# Patient Record
Sex: Female | Born: 1985 | Race: Black or African American | Hispanic: No | Marital: Single | Smoking: Current every day smoker
Health system: Southern US, Community
[De-identification: ages and names within clinical notes are randomized; demographics above are authoritative.]

## PROBLEM LIST (undated history)

## (undated) DIAGNOSIS — C539 Malignant neoplasm of cervix uteri, unspecified: Secondary | ICD-10-CM

## (undated) HISTORY — PX: ABDOMINAL CERCLAGE: SHX5384

## (undated) HISTORY — PX: KNEE SURGERY: SHX244

---

## 2018-04-01 ENCOUNTER — Emergency Department (HOSPITAL_COMMUNITY): Admission: EM | Admit: 2018-04-01 | Discharge: 2018-04-01 | Discharge status: Against Medical Advice | Payer: Self-pay

## 2018-04-01 NOTE — ED Notes (Signed)
Pt didn't answer again from triage

## 2018-04-01 NOTE — ED Notes (Signed)
Pt called for triage x1 No repsonse 

## 2018-04-25 ENCOUNTER — Other Ambulatory Visit: Payer: Self-pay

## 2018-04-25 ENCOUNTER — Encounter (HOSPITAL_COMMUNITY): Payer: Self-pay | Admitting: Emergency Medicine

## 2018-04-25 ENCOUNTER — Emergency Department (HOSPITAL_COMMUNITY)
Admission: EM | Admit: 2018-04-25 | Discharge: 2018-04-25 | Discharge status: Against Medical Advice | Payer: Medicaid Other | Attending: Emergency Medicine | Admitting: Emergency Medicine

## 2018-04-25 DIAGNOSIS — R109 Unspecified abdominal pain: Secondary | ICD-10-CM | POA: Diagnosis present

## 2018-04-25 DIAGNOSIS — R1030 Lower abdominal pain, unspecified: Secondary | ICD-10-CM | POA: Diagnosis not present

## 2018-04-25 DIAGNOSIS — F17200 Nicotine dependence, unspecified, uncomplicated: Secondary | ICD-10-CM | POA: Insufficient documentation

## 2018-04-25 HISTORY — DX: Malignant neoplasm of cervix uteri, unspecified: C53.9

## 2018-04-25 LAB — WET PREP, GENITAL
Clue Cells Wet Prep HPF POC: NONE SEEN
SPERM: NONE SEEN
Trich, Wet Prep: NONE SEEN
WBC, Wet Prep HPF POC: NONE SEEN
YEAST WET PREP: NONE SEEN

## 2018-04-25 LAB — URINALYSIS, ROUTINE W REFLEX MICROSCOPIC
Bilirubin Urine: NEGATIVE
Glucose, UA: NEGATIVE mg/dL
Hgb urine dipstick: NEGATIVE
KETONES UR: 5 mg/dL — AB
Leukocytes, UA: NEGATIVE
NITRITE: NEGATIVE
PH: 6 (ref 5.0–8.0)
Protein, ur: NEGATIVE mg/dL
SPECIFIC GRAVITY, URINE: 1.03 (ref 1.005–1.030)

## 2018-04-25 LAB — PREGNANCY, URINE: Preg Test, Ur: NEGATIVE

## 2018-04-25 NOTE — ED Provider Notes (Signed)
[facility] DEPT Provider Note   CSN: [id number] Arrival date & time: 04/25/18  1955     History   Chief Complaint Chief Complaint  Patient presents with  . Abdominal Pain    HPI Donna Ramos is a 32 y.o. female history of cervical cancer who presents for evaluation of intermittent abdominal pain that is been ongoing for last month.  Patient describes it as a cramping sensation.  She states it is constant but that it will vary in severity.  She states she has not taken anything for the pain.  She states that there is no alleviating or aggravating factor.  She has been able to drink and eat without any difficulty.  She denies any nausea/vomiting.  Patient states that she did not seek evaluation for this pain.  She states she came to the ED today because her boyfriend made her.  She denies any new symptoms or changes in symptoms.  Patient reports she has a history of IBS but does not follow-up with any GI doctor.  She states that this does not feel like a IBS flare.  Patient reports that 2 weeks ago, she had a large amount of vaginal bleeding.  She states that lasted for a few days and then resolved.  Denies any vaginal bleeding at this time.  She states her last menstrual cycle was in June.  She states her periods are abnormal.  She reports a history of tubal ligation.  She states she has had a history of cervical cancer and had her cervix stitch.  Patient does report that she smokes cigarettes.  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  Denies any cocaine, heroin use, alcohol.  Patient denies any fevers, chest pain, difficulty breathing, dysuria, hematuria, vaginal discharge.  The history is provided by the patient.    Past Medical History:  Diagnosis Date  . Cervical cancer [facility])    Patient did not ever get treatment    There are no active problems to display for this patient.   Past Surgical History:  Procedure  Laterality Date  . KNEE SURGERY       OB History   None      Home Medications    Prior to Admission medications   Not on File    Family History History reviewed. No pertinent family history.  Social History Social History   Tobacco Use  . Smoking status: Current Every Day Smoker  . Smokeless tobacco: Never Used  Substance Use Topics  . Alcohol use: Not Currently  . Drug use: Not Currently     Allergies   Patient has no known allergies.   Review of Systems Review of Systems  Constitutional: Negative for fever.  Respiratory: Negative for cough and shortness of breath.   Cardiovascular: Negative for chest pain.  Gastrointestinal: Positive for abdominal pain. Negative for nausea and vomiting.  Genitourinary: Positive for vaginal bleeding (Resolved). Negative for dysuria and hematuria.  Neurological: Negative for headaches.  All other systems reviewed and are negative.    Physical Exam Updated Vital Signs BP 115/65   Pulse 61   Temp 98.4 F (36.9 C) (Oral)   Resp 20   Ht 5\' 2"  (1.575 m)   Wt 77.1 kg   LMP 12/24/2017   SpO2 100%   BMI 31.09 kg/m   Physical Exam  Constitutional: She is oriented to person, place, and time. She appears well-developed and well-nourished.  HENT:  Head:  Normocephalic and atraumatic.  Mouth/Throat: Oropharynx is clear and moist and mucous membranes are normal.  Eyes: Pupils are equal, round, and reactive to light. Conjunctivae, EOM and lids are normal.  Neck: Full passive range of motion without pain.  Cardiovascular: Normal rate, regular rhythm, normal heart sounds and normal pulses. Exam reveals no gallop and no friction rub.  No murmur heard. Pulmonary/Chest: Effort normal and breath sounds normal.  Lungs clear to auscultation bilaterally.  Symmetric chest rise.  No wheezing, rales, rhonchi.  Abdominal: Soft. Normal appearance. There is tenderness in the right lower quadrant and suprapubic area. There is no rigidity, no  guarding and no CVA tenderness.  Abdomen is soft, nondistended.  Tenderness palpation noted to the right lower quadrant, suprapubic region.  No focal tenderness at McBurney's point.  No CVA tenderness bilaterally.  Genitourinary: Vagina normal and uterus normal. Cervix exhibits no motion tenderness, no discharge and no friability. Right adnexum displays no mass and no tenderness. Left adnexum displays no mass and no tenderness.  Genitourinary Comments: The exam was performed with a chaperone present. Normal external female genitalia. No lesions, rash, or sores.  Piercing noted to clitoris.  No surrounding warmth, erythema.  Cervical os is closed. Cervix is without any friability, erythema.  No evidence of abnormalities.  No adnexal mass or tenderness bilaterally.  Musculoskeletal: Normal range of motion.  Neurological: She is alert and oriented to person, place, and time.  Skin: Skin is warm and dry. Capillary refill takes less than 2 seconds.  Psychiatric: She has a normal mood and affect. Her speech is normal.  Nursing note and vitals reviewed.    ED Treatments / Results  Labs (all labs ordered are listed, but only abnormal results are displayed) Labs Reviewed  URINALYSIS, ROUTINE W REFLEX MICROSCOPIC - Abnormal; Notable for the following components:      Result Value   Ketones, ur 5 (*)    All other components within normal limits  WET PREP, GENITAL  PREGNANCY, URINE  LIPASE, BLOOD  COMPREHENSIVE METABOLIC PANEL  CBC  I-STAT BETA HCG BLOOD, ED ([facility], [facility], [facility] ONLY)  GC/CHLAMYDIA PROBE AMP ([place]) NOT AT [facility]    EKG None  Radiology No results found.  Procedures Procedures (including critical care time)  Medications Ordered in ED Medications - No data to display   Initial Impression / Assessment and Plan / ED Course  I have reviewed the triage vital signs and the nursing notes.  Pertinent labs & imaging results that were available during my care of the patient were  reviewed by me and considered in my medical decision making (see chart for details).     32 year old female who presents for evaluation of a month of abdominal cramping.  Reports that she also had an episode of vaginal bleeding approximately 2 weeks ago.  None since.  No fevers, nausea/vomiting.  States no new changes in symptoms but her boyfriend made her come in.  Last LMP was in June. Patient is afebrile, non-toxic appearing, sitting comfortably on examination table. Vital signs reviewed and stable.  On exam, tenderness palpation of the right lower quadrant, suprapubic region.  No CVA tenderness.  Consider infectious process though less likely given duration of symptoms and lack of fever, nausea/vomiting.  Also consider OB/GYN etiology versus uterine fibroids.  Plan for basic labs, pelvic exam.  Given pain in the right lower quadrant, will plan for CT abdomen pelvis to rule any acute infectious etiology.  Patient states that she was diagnosed with  cervical cancer in 2017 in [place].  She states that she was recommended to get chemotherapy but states she never got treatment.  Patient states that she had to get her cervix stitched up but does not know why.  She states that it occurred in 2014 after she had surgery.  She does not have an OB/GYN.  Again additionally, patient states that she was diagnosed with IBS but does not follow with any GI doctor.  Exam as documented above.  Cervix was without any abnormalities.  No CMT that would be concerning for PID.  No adnexal mass or tenderness bilaterally.  UA is negative for any hemoglobin, infectious etiology.  Urine pregnancy negative.  Wet prep is unremarkable.  Staff had difficulty obtaining IV and blood work was unable to be collected.  Multiple people tried.  RN informed me that IV team was consulted and were unable to get it.  I discussed with patient that we could try another member of our team to attempt an ultrasound-guided IV.  Patient refused to  be stuck anymore.  I discussed with patient that I cannot tell her any further information about her symptoms without blood work and further evaluation.  Additionally, I discussed with patient that I had a CT on pelvis ordered.  Patient states that she does not want to be stuck anymore and refuses any further work-up here in ED.  I discussed with patient that we would not be able to tell her any information regarding her symptoms without further work-up in the ED, including lab work, imaging, ultrasound if necessary.  Patient states that she was concerned that she was pregnant.  Discussed with her that urine pregnancy was negative but I cannot rule out any other acute abnormalities.  Discussed with patient that leaving prior to work-up being completed would be leaving [place].  Patient understands risk first benefits of leaving does not wish to stay any longer.  Final Clinical Impressions(s) / ED Diagnoses   Final diagnoses:  Lower abdominal pain    ED Discharge Orders    None       Desma Mcgregor 04/25/18 [id number]    Orlie Dakin, MD 04/26/18 [id number]

## 2018-04-25 NOTE — ED Notes (Signed)
Ultrasound IV and lab draw attempted without success.

## 2018-04-25 NOTE — ED Notes (Signed)
I attempted to collect labs and was unsuccessful. 

## 2018-04-25 NOTE — ED Notes (Signed)
Pt is refusing blood work and PIV sts just wants to know if she is prgenant, PA is aware.

## 2018-04-25 NOTE — ED Triage Notes (Signed)
Patient complaining of heavy bleeding. Patient states she has not had a period up until now. Patient states she was dx with cervical cancer and was not treated. Patient also states she is having abdominal pain.

## 2018-04-27 LAB — GC/CHLAMYDIA PROBE AMP (~~LOC~~) NOT AT ARMC
CHLAMYDIA, DNA PROBE: NEGATIVE
Neisseria Gonorrhea: NEGATIVE

## 2018-04-28 ENCOUNTER — Encounter (HOSPITAL_COMMUNITY): Payer: Self-pay | Admitting: *Deleted

## 2018-04-28 ENCOUNTER — Inpatient Hospital Stay (EMERGENCY_DEPARTMENT_HOSPITAL)
Admission: AD | Admit: 2018-04-28 | Discharge: 2018-04-28 | Disposition: A | Discharge status: Home / Self Care | Payer: Medicaid Other | Source: Ambulatory Visit | Attending: Obstetrics and Gynecology | Admitting: Obstetrics and Gynecology

## 2018-04-28 ENCOUNTER — Inpatient Hospital Stay (HOSPITAL_COMMUNITY)
Admission: AD | Admit: 2018-04-28 | Discharge: 2018-04-28 | Disposition: A | Discharge status: Home / Self Care | Payer: Medicaid Other | Source: Ambulatory Visit | Attending: Obstetrics and Gynecology | Admitting: Obstetrics and Gynecology

## 2018-04-28 ENCOUNTER — Encounter (HOSPITAL_COMMUNITY): Payer: Self-pay | Admitting: Emergency Medicine

## 2018-04-28 DIAGNOSIS — N920 Excessive and frequent menstruation with regular cycle: Secondary | ICD-10-CM | POA: Diagnosis present

## 2018-04-28 DIAGNOSIS — Z3202 Encounter for pregnancy test, result negative: Secondary | ICD-10-CM | POA: Insufficient documentation

## 2018-04-28 DIAGNOSIS — F17210 Nicotine dependence, cigarettes, uncomplicated: Secondary | ICD-10-CM | POA: Diagnosis not present

## 2018-04-28 DIAGNOSIS — N939 Abnormal uterine and vaginal bleeding, unspecified: Secondary | ICD-10-CM

## 2018-04-28 DIAGNOSIS — N921 Excessive and frequent menstruation with irregular cycle: Secondary | ICD-10-CM

## 2018-04-28 LAB — URINALYSIS, ROUTINE W REFLEX MICROSCOPIC
Bacteria, UA: NONE SEEN
GLUCOSE, UA: NEGATIVE mg/dL
KETONES UR: 20 mg/dL — AB
Nitrite: NEGATIVE
Specific Gravity, Urine: 1.04 — ABNORMAL HIGH (ref 1.005–1.030)
pH: 5 (ref 5.0–8.0)

## 2018-04-28 LAB — CBC
HCT: 40.1 % (ref 36.0–46.0)
Hemoglobin: 13.8 g/dL (ref 12.0–15.0)
MCH: 32.6 pg (ref 26.0–34.0)
MCHC: 34.4 g/dL (ref 30.0–36.0)
MCV: 94.8 fL (ref 80.0–100.0)
NRBC: 0 % (ref 0.0–0.2)
PLATELETS: 300 10*3/uL (ref 150–400)
RBC: 4.23 MIL/uL (ref 3.87–5.11)
RDW: 15.1 % (ref 11.5–15.5)
WBC: 6.2 10*3/uL (ref 4.0–10.5)

## 2018-04-28 LAB — POCT PREGNANCY, URINE: Preg Test, Ur: NEGATIVE

## 2018-04-28 LAB — HCG, QUANTITATIVE, PREGNANCY

## 2018-04-28 NOTE — MAU Note (Addendum)
Pt states she has been bleeding a lot since the early morning when she was here before. States she is bleeding and passing clots. Pt states she is having bright red bleeding that is so much it is just leaking out when she sits on toilet. Pt states she has had her tubes tied and that she also has her cervix 'stitched up'. Pt states LMP in June and that this is not her normal period. Pt states she feels movement in her abdomen. Pt states she is very worried she is losing a baby and even called it 'him.'  Pt aware that has had two negative pregnancy tests recently in MAU.  S.O. at bedside.

## 2018-04-28 NOTE — Discharge Instructions (Signed)
Menorrhagia Menorrhagia is when your menstrual periods are heavy or last longer than usual. Follow these instructions at home:  Only take medicine as told by your doctor.  Take any iron pills as told by your doctor. Heavy bleeding may cause low levels of iron in your body.  Do not take aspirin 1 week before or during your period. Aspirin can make the bleeding worse.  Lie down for a while if you change your tampon or pad more than once in 2 hours. This may help lessen the bleeding.  Eat a healthy diet and foods with iron. These foods include leafy green vegetables, meat, liver, eggs, and whole grain breads and cereals.  Do not try to lose weight. Wait until the heavy bleeding has stopped and your iron level is normal. Contact a doctor if:  You soak through a pad or tampon every 1 or 2 hours, and this happens every time you have a period.  You need to use pads and tampons at the same time because you are bleeding so much.  You need to change your pad or tampon during the night.  You have a period that lasts for more than 8 days.  You pass clots bigger than 1 inch (2.5 cm) wide.  You have irregular periods that happen more or less often than once a month.  You feel dizzy or pass out (faint).  You feel very weak or tired.  You feel short of breath or feel your heart is beating too fast when you exercise.  You feel sick to your stomach (nausea) and you throw up (vomit) while you are taking your medicine.  You have watery poop (diarrhea) while you are taking your medicine.  You have any problems that may be related to the medicine you are taking. Get help right away if:  You soak through 4 or more pads or tampons in 2 hours.  You have any bleeding while you are pregnant. This information is not intended to replace advice given to you by your health care provider. Make sure you discuss any questions you have with your health care provider. Document Released: 04/09/2008 Document  Revised: 12/07/2015 Document Reviewed: 12/31/2012 Elsevier Interactive Patient Education  2017 Elsevier Inc.  

## 2018-04-28 NOTE — MAU Note (Signed)
Pt presents to MAU c/o vaginal bleeding that started today pt states the bleeding is dark red and she is worried because the color of the bleeding is different than what she is used to. Pt reports being diagnosed with cervical cancer in 2017 pt states she was never treated because she is scared of hospital and bad news. Pt reports a hx of 2 stillborns and hx of having her tubs tied.

## 2018-04-28 NOTE — MAU Provider Note (Addendum)
History     CSN: [id number]  Arrival date and time: 04/28/18 6789   First Provider Initiated Contact with Patient 04/28/18 [phone]      Chief Complaint  Patient presents with  . Vaginal Bleeding  . Abdominal Pain   32 y.o female here with VB x2 days. Reports heavy VB into the toilet and passing clots. States this happened after she was here earlier this am. LMP was in June but had 3 days of bleeding about 2 weeks ago. Endorses heartburn type pain in her abdomen. She refuses to give more detail because "ya'll are just wasting time asking questions, please just do something". 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 She states "if I'm pregnant I need ya'll to do something now and not wait". She feels the bleeding resembles a pregnancy loss because of the color and quantity of the blood. Reports having 2 fevers at home, would not specify more. Partner is asking for an [place]. She is s/p BTL.   Past Medical History:  Diagnosis Date  . Cervical cancer [facility])    Patient did not ever get treatment    Past Surgical History:  Procedure Laterality Date  . KNEE SURGERY      Family History  Problem Relation Age of Onset  . ADD / ADHD Neg Hx   . Alcohol abuse Neg Hx   . Anxiety disorder Neg Hx   . Arthritis Neg Hx   . Asthma Neg Hx   . Birth defects Neg Hx   . Cancer Neg Hx   . COPD Neg Hx   . Depression Neg Hx   . Diabetes Neg Hx   . Drug abuse Neg Hx   . Early death Neg Hx   . Heart disease Neg Hx   . Hearing loss Neg Hx   . Hypertension Neg Hx   . Hyperlipidemia Neg Hx   . Intellectual disability Neg Hx   . Kidney disease Neg Hx   . Learning disabilities Neg Hx   . Miscarriages / Stillbirths Neg Hx   . Obesity Neg Hx   . Stroke Neg Hx   . Vision loss Neg Hx   . Varicose Veins Neg Hx     Social History   Tobacco Use  . Smoking status: Current Every Day Smoker    Packs/day: 0.50  . Smokeless tobacco: Never Used  Substance Use  Topics  . Alcohol use: Not Currently  . Drug use: Not Currently    Allergies: No Known Allergies  Medications Prior to Admission  Medication Sig Dispense Refill Last Dose  . acetaminophen (TYLENOL) 500 MG tablet Take 500-1,000 mg by mouth every 6 (six) hours as needed for moderate pain.   Past Week at Unknown time    Review of Systems  Gastrointestinal: Positive for abdominal pain.  Genitourinary: Positive for vaginal bleeding.   Physical Exam   Blood pressure (!) 139/93, pulse (!) 106, temperature 98.5 F (36.9 C), temperature source Oral, resp. rate 18, height 5\' 2"  (1.575 m), weight 73.4 kg.  Physical Exam  Constitutional: She appears well-developed and well-nourished. She appears distressed.  HENT:  Head: Normocephalic.  Neck: Normal range of motion.  Respiratory: Effort normal. No respiratory distress.  GI: Soft. She exhibits no distension and no mass. There is no tenderness. There is no rebound and no guarding.  Genitourinary:  Genitourinary Comments: External: no lesions or erythema Vagina: rugated, pink, moist, small drk red bloody discharge,  cleared with 1 fox swab Uterus: non enlarged, anteverted, non tender, no CMT Adnexae: no masses, no tenderness left, no tenderness right Cervix normal   Musculoskeletal: Normal range of motion.  Neurological: She is alert.  Skin: Skin is warm and dry.  Psychiatric: Her mood appears anxious.   Results for orders placed or performed during the hospital encounter of 04/28/18 (from the past 24 hour(s))  CBC     Status: None   Collection Time: 04/28/18  9:13 AM  Result Value Ref Range   WBC 6.2 4.0 - 10.5 K/uL   RBC 4.23 3.87 - 5.11 MIL/uL   Hemoglobin 13.8 12.0 - 15.0 g/dL   HCT 40.1 36.0 - 46.0 %   MCV 94.8 80.0 - 100.0 fL   MCH 32.6 26.0 - 34.0 pg   MCHC 34.4 30.0 - 36.0 g/dL   RDW 15.1 11.5 - 15.5 %   Platelets 300 150 - 400 K/uL   nRBC 0.0 0.0 - 0.2 %  hCG, quantitative, pregnancy     Status: None   Collection Time:  04/28/18  9:13 AM  Result Value Ref Range   hCG, Beta Chain, Quant, S <1 <5 mIU/mL   MAU Course  Procedures  MDM Labs ordered and reviewed. No evidence of pregnancy, pt and partner relieved by this. Discussed sx are consistent with menorrhagia. No evidence of acute abdominal or pelvic process. Outpt pelvic US ordered from previous MAU encounter. Stable for discharge home.  Assessment and Plan   1. Menorrhagia with irregular cycle   2. Pregnancy examination or test, negative result    Discharge home Follow up in [place] in 2 weeks Pelvic US in 1 week Return for emergencies  Allergies as of 04/28/2018   No Known Allergies     Medication List    TAKE these medications   acetaminophen 500 MG tablet Commonly known as:  TYLENOL Take 500-1,000 mg by mouth every 6 (six) hours as needed for moderate pain.      Julianne Handler, CNM 04/28/2018, 10:59 AM

## 2018-04-28 NOTE — Discharge Instructions (Signed)
° ° °  Center for [facility] at [facility]       Phone: [phone]  Center for [place] at [place] Phone: [place] for [place] at [place]  Phone: [place] for [place] at [facility]  Phone: [place] for [place] at [place]  Phone: [phone]      Dysfunctional Uterine Bleeding Dysfunctional uterine bleeding is abnormal bleeding from the uterus. Dysfunctional uterine bleeding includes:  A period that comes earlier or later than usual.  A period that is lighter, heavier, or has blood clots.  Bleeding between periods.  Skipping one or more periods.  Bleeding after sexual intercourse.  Bleeding after menopause.  Follow these instructions at home: Pay attention to any changes in your symptoms. Follow these instructions to help with your condition: Eating and drinking  Eat well-balanced meals. Include foods that are high in iron, such as liver, meat, shellfish, green leafy vegetables, and eggs.  If you become constipated: ? Drink plenty of water. ? Eat fruits and vegetables that are high in water and fiber, such as spinach, carrots, raspberries, apples, and mango. Medicines  Take over-the-counter and prescription medicines only as told by your health care provider.  Do not change medicines without talking with your health care provider.  Aspirin or medicines that contain aspirin may make the bleeding worse. Do not take those medicines: ? During the week before your period. ? During your period.  If you were prescribed iron pills, take them as told by your health care provider. Iron pills help to replace iron that your body loses because of this condition. Activity  If you need to change your sanitary pad or tampon more than one time every 2 hours: ? Lie in bed with your feet raised (elevated). ? Place a cold pack on your lower abdomen. ? Rest as much as  possible until the bleeding stops or slows down.  Do not try to lose weight until the bleeding has stopped and your blood iron level is back to normal. Other Instructions  For two months, write down: ? When your period starts. ? When your period ends. ? When any abnormal bleeding occurs. ? What problems you notice.  Keep all follow up visits as told by your health care provider. This is important. Contact a health care provider if:  You get light-headed or weak.  You have nausea and vomiting.  You cannot eat or drink without vomiting.  You feel dizzy or have diarrhea while you are taking medicines.  You are taking birth control pills or hormones, and you want to change them or stop taking them. Get help right away if:  You develop a fever or chills.  You need to change your sanitary pad or tampon more than one time per hour.  Your bleeding becomes heavier, or your flow contains clots more often.  You develop pain in your abdomen.  You lose consciousness.  You develop a rash. This information is not intended to replace advice given to you by your health care provider. Make sure you discuss any questions you have with your health care provider. Document Released: 06/28/2000 Document Revised: 12/07/2015 Document Reviewed: 09/26/2014 Elsevier Interactive Patient Education  [employer].

## 2018-04-28 NOTE — MAU Provider Note (Signed)
Chief Complaint: Vaginal Bleeding   First Provider Initiated Contact with Patient 04/28/18 0345     SUBJECTIVE HPI: Donna Ramos is a 32 y.o. [id number] at Unknown who presents to Maternity Admissions reporting vaginal bleeding. Had BTL in 2015. Reports being diagnosed with cervical cancer in 2017 in [place]; was told she needed chemo so she didn't f/u b/c she didn't want chemo. Had normal cycles until June. Did not have any bleeding from June until a few weeks ago. Has had bleeding off & on for the last 2 weeks. Was seen in ED 2 days ago with abdominal pain & vaginal bleeding. Had negative GC/CT, negative UPT, and normal pelvic exam.  States bleeding started back this morning & became heavy. Used 1 tampon, states if she had more tampons that she would have gone through 2 in 30 minutes. Does not have pads, so used toilet paper. Not saturating underwear. States blood is darker than normal for her. Denies abdominal pain.   Past Medical History:  Diagnosis Date  . Cervical cancer ([place])    Patient did not ever get treatment   OB History  Gravida Para Term Preterm AB Living  6 4 3 1 2 2   SAB TAB Ectopic Multiple Live Births               # Outcome Date GA Lbr Len/2nd Weight Sex Delivery Anes PTL Lv  6 Term         FD  5 Preterm         FD  4 AB           3 AB           2 Term           1 Term            Past Surgical History:  Procedure Laterality Date  . KNEE SURGERY     Social History   Socioeconomic History  . Marital status: Single    Spouse name: Not on file  . Number of children: Not on file  . Years of education: Not on file  . Highest education level: Not on file  Occupational History  . Not on file  Social Needs  . Financial resource strain: Not on file  . Food insecurity:    Worry: Not on file    Inability: Not on file  . Transportation needs:    Medical: Not on file    Non-medical: Not on file  Tobacco Use  . Smoking status: Current Every Day Smoker    Packs/day:  0.50  . Smokeless tobacco: Never Used  Substance and Sexual Activity  . Alcohol use: Not Currently  . Drug use: Not Currently  . Sexual activity: Yes  Lifestyle  . Physical activity:    Days per week: Not on file    Minutes per session: Not on file  . Stress: Not on file  Relationships  . Social connections:    Talks on phone: Not on file    Gets together: Not on file    Attends religious service: Not on file    Active member of club or organization: Not on file    Attends meetings of clubs or organizations: Not on file    Relationship status: Not on file  . Intimate partner violence:    Fear of current or ex partner: Not on file    Emotionally abused: Not on file    Physically abused: Not on file  Forced sexual activity: Not on file  Other Topics Concern  . Not on file  Social History Narrative  . Not on file   Family History  Problem Relation Age of Onset  . ADD / ADHD Neg Hx   . Alcohol abuse Neg Hx   . Anxiety disorder Neg Hx   . Arthritis Neg Hx   . Asthma Neg Hx   . Birth defects Neg Hx   . Cancer Neg Hx   . COPD Neg Hx   . Depression Neg Hx   . Diabetes Neg Hx   . Drug abuse Neg Hx   . Early death Neg Hx   . Heart disease Neg Hx   . Hearing loss Neg Hx   . Hypertension Neg Hx   . Hyperlipidemia Neg Hx   . Intellectual disability Neg Hx   . Kidney disease Neg Hx   . Learning disabilities Neg Hx   . Miscarriages / Stillbirths Neg Hx   . Obesity Neg Hx   . Stroke Neg Hx   . Vision loss Neg Hx   . Varicose Veins Neg Hx    No current facility-administered medications on file prior to encounter.    No current outpatient medications on file prior to encounter.   No Known Allergies  I have reviewed patient's Past Medical Hx, Surgical Hx, Family Hx, Social Hx, medications and allergies.   Review of Systems  Constitutional: Negative.   Gastrointestinal: Negative.   Genitourinary: Positive for menstrual problem and vaginal bleeding.     OBJECTIVE Patient Vitals for the past 24 hrs:  BP Temp Temp src Pulse Resp SpO2  04/28/18 0449 136/81 - - (!) 110 17 100 %  04/28/18 0308 (!) 129/59 98.6 F (37 C) Oral (!) 109 17 -   Constitutional: Well-developed, well-nourished female in no acute distress.  Cardiovascular: normal rate & rhythm, no murmur Respiratory: normal rate and effort. Lung sounds clear throughout GI: Abd soft, non-tender, Pos BS x 4. No guarding or rebound tenderness MS: Extremities nontender, no edema, normal ROM Neurologic: Alert and oriented x 4.  GU:     SPECULUM EXAM: NEFG, physiologic discharge, minimal dark red blood noted on exam. Cervix pink & smooth  BIMANUAL: No CMT. uterus normal size, no adnexal tenderness or masses.    LAB RESULTS Results for orders placed or performed during the hospital encounter of 04/28/18 (from the past 24 hour(s))  Urinalysis, Routine w reflex microscopic     Status: Abnormal   Collection Time: 04/28/18  3:06 AM  Result Value Ref Range   Color, Urine AMBER (A) YELLOW   APPearance CLOUDY (A) CLEAR   Specific Gravity, Urine 1.040 (H) 1.005 - 1.030   pH 5.0 5.0 - 8.0   Glucose, UA NEGATIVE NEGATIVE mg/dL   Hgb urine dipstick LARGE (A) NEGATIVE   Bilirubin Urine SMALL (A) NEGATIVE   Ketones, ur 20 (A) NEGATIVE mg/dL   Protein, ur >=300 (A) NEGATIVE mg/dL   Nitrite NEGATIVE NEGATIVE   Leukocytes, UA SMALL (A) NEGATIVE   RBC / HPF >50 (H) 0 - 5 RBC/hpf   WBC, UA 21-50 0 - 5 WBC/hpf   Bacteria, UA NONE SEEN NONE SEEN   Squamous Epithelial / LPF 21-50 0 - 5   Mucus PRESENT   Pregnancy, urine POC     Status: None   Collection Time: 04/28/18  3:19 AM  Result Value Ref Range   Preg Test, Ur NEGATIVE NEGATIVE    IMAGING No results found.  MAU COURSE Orders Placed This Encounter  Procedures  . US PELVIS TRANSVANGINAL NON-OB (TV ONLY)  . US PELVIS (TRANSABDOMINAL ONLY)  . Urinalysis, Routine w reflex microscopic  . Pregnancy, urine POC  . Discharge patient    No orders of the defined types were placed in this encounter.   MDM UPT negative Did not collect vaginal swabs as they were negative 2 days ago Discussed CBC with patient which she was agreeable to. When phlebotomist entered room, pt refused lab draw Pt requesting ultrasound. Emergent gyn ultrasound not indicated tonight. Will order outpatient ultrasound. Pt needs f/u with gyn for management of AUB & to reevaluate for cervical cancer; no record of diagnosis in care everywhere.   ASSESSMENT 1. Abnormal uterine bleeding (AUB)     PLAN Discharge home in stable condition.  Follow-up [facility] Follow up.   Specialty:  Radiology Why:  Will call you to schedule outpatient ultrasound Contact information: [address] [id number] [place] [phone]         Allergies as of 04/28/2018   No Known Allergies     Medication List    You have not been prescribed any medications.      Jorje Guild, NP 04/28/2018  9:35 AM

## 2018-05-01 ENCOUNTER — Ambulatory Visit (HOSPITAL_COMMUNITY)
Admission: RE | Admit: 2018-05-01 | Discharge: 2018-05-01 | Disposition: A | Discharge status: Home / Self Care | Payer: Medicaid Other | Source: Ambulatory Visit | Attending: Student | Admitting: Student

## 2018-05-01 DIAGNOSIS — N939 Abnormal uterine and vaginal bleeding, unspecified: Secondary | ICD-10-CM | POA: Diagnosis present

## 2018-05-02 ENCOUNTER — Encounter: Payer: Self-pay | Admitting: Student

## 2018-05-04 ENCOUNTER — Telehealth: Payer: Self-pay | Admitting: Obstetrics & Gynecology

## 2018-05-04 NOTE — Telephone Encounter (Signed)
Called patient to give her an appointment. Patient id requesting a call back from the nurse about her results.

## 2018-05-06 NOTE — Telephone Encounter (Signed)
I returned pt's call and discussed her concern. She stated that she wants to know what is going on with her body and why she has been having abdominal pain and vaginal bleeding. I advised that when she has office appt on 11/6, the doctor will review all of her test results and most likely perform a pelvic exam. The doctor will make recommendations for treatment options based on all of the information obtained. Pt voiced understanding.

## 2018-05-18 ENCOUNTER — Telehealth: Payer: Self-pay | Admitting: *Deleted

## 2018-05-18 NOTE — Telephone Encounter (Signed)
Received a voicemail from 05/15/18 5:42pm( after office closed for weekend) stating she is calling about results and that someone called about something. I do see a note from several days ago that Donna Ramos has an appointment 05/20/18 2:55 to review results and plan of care.  I called Donna Ramos and left a message I am returning her call and I do see she has appt 05/20/18 and we can answer her questions then- call ius again if she has questions before then.

## 2018-05-20 ENCOUNTER — Encounter: Payer: Medicaid Other | Admitting: Obstetrics & Gynecology

## 2018-05-20 NOTE — Progress Notes (Deleted)
   Patient did not show up today for her scheduled appointment.   Heywood Tokunaga, MD, FACOG Obstetrician & Gynecologist, [facility], [facility]  

## 2018-05-21 ENCOUNTER — Telehealth: Payer: Self-pay | Admitting: Family Medicine

## 2018-05-21 NOTE — Telephone Encounter (Signed)
Pt called wanting to know her test results and would like for someone to give her a call back. Stated someone had called her before but she had missed the call and did not have a chance to call back.

## 2018-05-21 NOTE — Telephone Encounter (Signed)
I called Donna Ramos back and reviewed her lab results from 04/28/18 and [place] from 05/01/18 with her; as well as her gyn appointment. I advised her to keep appt as provider would review results with her, do exam and any further testing needed for bleeding issues. Also to go to MAU for heavy bleeding. She voices understanding.

## 2018-06-08 ENCOUNTER — Encounter: Payer: Medicaid Other | Admitting: Obstetrics & Gynecology

## 2019-01-09 ENCOUNTER — Other Ambulatory Visit: Payer: Self-pay

## 2019-01-09 ENCOUNTER — Emergency Department (HOSPITAL_COMMUNITY)
Admission: EM | Admit: 2019-01-09 | Discharge: 2019-01-09 | Disposition: A | Discharge status: Home / Self Care | Payer: Medicaid Other | Attending: Emergency Medicine | Admitting: Emergency Medicine

## 2019-01-09 ENCOUNTER — Emergency Department (HOSPITAL_COMMUNITY)
Admission: EM | Admit: 2019-01-09 | Discharge: 2019-01-10 | Disposition: A | Discharge status: Home / Self Care | Payer: Medicaid Other | Source: Home / Self Care | Attending: Emergency Medicine | Admitting: Emergency Medicine

## 2019-01-09 ENCOUNTER — Encounter (HOSPITAL_COMMUNITY): Payer: Self-pay

## 2019-01-09 DIAGNOSIS — K0889 Other specified disorders of teeth and supporting structures: Secondary | ICD-10-CM

## 2019-01-09 DIAGNOSIS — R220 Localized swelling, mass and lump, head: Secondary | ICD-10-CM | POA: Diagnosis not present

## 2019-01-09 DIAGNOSIS — Z8541 Personal history of malignant neoplasm of cervix uteri: Secondary | ICD-10-CM | POA: Diagnosis not present

## 2019-01-09 DIAGNOSIS — K047 Periapical abscess without sinus: Secondary | ICD-10-CM

## 2019-01-09 DIAGNOSIS — K029 Dental caries, unspecified: Secondary | ICD-10-CM

## 2019-01-09 DIAGNOSIS — F17210 Nicotine dependence, cigarettes, uncomplicated: Secondary | ICD-10-CM | POA: Diagnosis not present

## 2019-01-09 DIAGNOSIS — K122 Cellulitis and abscess of mouth: Secondary | ICD-10-CM

## 2019-01-09 MED ORDER — KETOROLAC TROMETHAMINE 30 MG/ML IJ SOLN
30.0000 mg | Freq: Once | INTRAMUSCULAR | Status: AC | PRN
  Administered 2019-01-10: 30 mg via INTRAVENOUS
  Filled 2019-01-09: qty 1

## 2019-01-09 MED ORDER — OXYCODONE-ACETAMINOPHEN 5-325 MG PO TABS
1.0000 | ORAL_TABLET | Freq: Once | ORAL | Status: AC
  Administered 2019-01-09: 1 via ORAL
  Filled 2019-01-09: qty 1

## 2019-01-09 MED ORDER — PENICILLIN V POTASSIUM 500 MG PO TABS: 500.0000 mg | ORAL_TABLET | Freq: Four times a day (QID) | ORAL | 0 refills | Status: AC

## 2019-01-09 MED ORDER — OXYCODONE HCL 5 MG PO TABS
5.0000 mg | ORAL_TABLET | Freq: Once | ORAL | Status: AC
  Administered 2019-01-09: 5 mg via ORAL
  Filled 2019-01-09: qty 1

## 2019-01-09 MED ORDER — CLINDAMYCIN PHOSPHATE 600 MG/50ML IV SOLN
600.0000 mg | Freq: Once | INTRAVENOUS | Status: AC
  Administered 2019-01-09: 600 mg via INTRAVENOUS
  Filled 2019-01-09: qty 50

## 2019-01-09 NOTE — ED Provider Notes (Signed)
[facility] EMERGENCY DEPARTMENT Provider Note   CSN: [id number] Arrival date & time: 01/09/19  1530     History   Chief Complaint Chief Complaint  Patient presents with  . Dental Pain    HPI Donna Ramos is a 33 y.o. female.     The history is provided by the patient and medical records. No language interpreter was used.  Dental Pain Associated symptoms: facial swelling   Associated symptoms: no fever     Donna Ramos is a 33 y.o. female who presents to the Emergency Department complaining of persistent, gradually worsening, right-sided, lower dental pain beginning 3 days ago. Pt describes their pain as throbbing. Had some mild swelling close to the sight beginning this morning. Pt has been taking ibuprofen and using Oragel at home with minimal relief of pain. Pt denies fever, chills, difficulty breathing, difficulty swallowing.    Past Medical History:  Diagnosis Date  . Cervical cancer [facility])    Patient did not ever get treatment    There are no active problems to display for this patient.   Past Surgical History:  Procedure Laterality Date  . ABDOMINAL CERCLAGE    . KNEE SURGERY       OB History    Gravida  6   Para  4   Term  3   Preterm  1   AB  2   Living  2     SAB      TAB      Ectopic      Multiple      Live Births               Home Medications    Prior to Admission medications   Medication Sig Start Date End Date Taking? Authorizing Provider  acetaminophen (TYLENOL) 500 MG tablet Take 500-1,000 mg by mouth every 6 (six) hours as needed for moderate pain.    [provider]  penicillin v potassium (VEETID) 500 MG tablet Take 1 tablet (500 mg total) by mouth 4 (four) times daily for 7 days. 01/09/19 01/16/19  Tami Blass, Ozella Almond, PA-C    Family History Family History  Problem Relation Age of Onset  . ADD / ADHD Neg Hx   . Alcohol abuse Neg Hx   . Anxiety disorder Neg Hx   . Arthritis Neg  Hx   . Asthma Neg Hx   . Birth defects Neg Hx   . Cancer Neg Hx   . COPD Neg Hx   . Depression Neg Hx   . Diabetes Neg Hx   . Drug abuse Neg Hx   . Early death Neg Hx   . Heart disease Neg Hx   . Hearing loss Neg Hx   . Hypertension Neg Hx   . Hyperlipidemia Neg Hx   . Intellectual disability Neg Hx   . Kidney disease Neg Hx   . Learning disabilities Neg Hx   . Miscarriages / Stillbirths Neg Hx   . Obesity Neg Hx   . Stroke Neg Hx   . Vision loss Neg Hx   . Varicose Veins Neg Hx     Social History Social History   Tobacco Use  . Smoking status: Current Every Day Smoker    Packs/day: 0.50  . Smokeless tobacco: Never Used  Substance Use Topics  . Alcohol use: Not Currently  . Drug use: Not Currently     Allergies   Patient has no known allergies.   Review  of Systems Review of Systems  Constitutional: Negative for chills and fever.  HENT: Positive for dental problem and facial swelling. Negative for sore throat and trouble swallowing.   Respiratory: Negative for cough and shortness of breath.      Physical Exam Updated Vital Signs BP 132/87   Pulse 98   Temp 98 F (36.7 C) (Oral)   Resp 18   SpO2 99%   Physical Exam Vitals signs and nursing note reviewed.  Constitutional:      General: She is not in acute distress.    Appearance: She is well-developed.  HENT:     Head: Normocephalic and atraumatic.     Mouth/Throat:      Comments: Dental cavities and poor oral dentition noted. Pain along tooth as depicted in image. No abscess noted. Midline uvula. No trismus. OP moist and clear. No oropharyngeal erythema or edema. Neck supple with no tenderness. Very mild facial edema overlying affected tooth. Neck:     Musculoskeletal: Neck supple.  Cardiovascular:     Rate and Rhythm: Normal rate and regular rhythm.     Heart sounds: Normal heart sounds. No murmur.  Pulmonary:     Effort: Pulmonary effort is normal. No respiratory distress.     Breath sounds:  Normal breath sounds. No wheezing or rales.  Abdominal:     Comments: Mild epigastric tenderness. No RUQ tenderness. Negative Murphy's.   Musculoskeletal: Normal range of motion.  Skin:    General: Skin is warm and dry.  Neurological:     Mental Status: She is alert.      ED Treatments / Results  Labs (all labs ordered are listed, but only abnormal results are displayed) Labs Reviewed - No data to display  EKG    Radiology No results found.  Procedures Procedures (including critical care time)  Medications Ordered in ED Medications  oxyCODONE (Oxy IR/ROXICODONE) immediate release tablet 5 mg (has no administration in time range)     Initial Impression / Assessment and Plan / ED Course  I have reviewed the triage vital signs and the nursing notes.  Pertinent labs & imaging results that were available during my care of the patient were reviewed by me and considered in my medical decision making (see chart for details).       Donna Ramos is a 33 y.o. female who presents to ED for dental pain. No abscess requiring immediate incision and drainage. Patient is afebrile, non toxic appearing, and swallowing secretions well. Exam not concerning for Ludwig's angina or pharyngeal abscess. Will treat with PenVK. Having some upper abdominal discomfort after taking multiple doses of NSAID's in the last 3 days. Non-surgical abdominal exam. Likely 2/2 nsaid's. Encouraged tylenol instead. I provided dental referral info and stressed the importance of dental follow up for ultimate management of dental pain. Patient voices understanding and is agreeable to plan.   Final Clinical Impressions(s) / ED Diagnoses   Final diagnoses:  Pain, dental    ED Discharge Orders         Ordered    penicillin v potassium (VEETID) 500 MG tablet  4 times daily     01/09/19 1614           Yamir Carignan, Ozella Almond, PA-C 01/09/19 1618    Lajean Saver, MD 01/09/19 1723

## 2019-01-09 NOTE — ED Triage Notes (Signed)
Pt c/o right lower dental pain x3 days along with right sided cheek swelling

## 2019-01-09 NOTE — ED Provider Notes (Signed)
33 year old female received at sign out from [place] [facility] pending labs and imaging. Per her HPI:   "The history is provided by the patient and medical records. No language interpreter was used.  Dental Pain Associated symptoms: facial swelling    Donna Ramos is a 33 y.o. female who presents to the Emergency Department complaining of worsening facial swelling since ED evaluation earlier today. I saw this patient several hours ago for right lower dental pain. She reports that she has continued to have pain not relieved with Tylenol, but of more concern to her was the fact that the right side of her face began to swell up very badly. She did get PenVK rx filled. She took one dose when she picked up the prescription and another before she laid down for bed. She couldn't sleep so went to the bathroom and noticed how swollen her face looked compared to earlier. I told her to come back to ER should the swelling get drastically worse, which she did. She still has no fever, difficulty breathing or swallowing."  Physical Exam  BP 119/64 (BP Location: Right Arm)   Pulse (!) 108   Temp 99.1 F (37.3 C) (Oral)   Resp 18   SpO2 100%   Physical Exam Vitals signs and nursing note reviewed.  Constitutional:      General: She is not in acute distress. HENT:     Head: Normocephalic.     Nose: Nose normal.     Mouth/Throat:     Mouth: No angioedema.     Pharynx: Oropharynx is clear. Uvula midline.     Comments: Swelling noted to the right upper lip and right lower face.  Posterior oropharynx is patent.  No trismus.  She is tolerating secretions without difficulty.  She has submental tenderness, but no sublingual induration or swelling. Eyes:     Conjunctiva/sclera: Conjunctivae normal.  Neck:     Musculoskeletal: Neck supple.  Cardiovascular:     Rate and Rhythm: Normal rate and regular rhythm.     Heart sounds: No murmur. No friction rub. No gallop.   Pulmonary:     Effort: Pulmonary effort is  normal. No respiratory distress.     Breath sounds: Normal breath sounds. No stridor. No wheezing, rhonchi or rales.     Comments: Lungs are clear to auscultation bilaterally.  No tachypnea.  No increased work of breathing. Abdominal:     General: There is no distension.     Palpations: Abdomen is soft.  Skin:    General: Skin is warm.     Findings: No rash.  Neurological:     Mental Status: She is alert.  Psychiatric:        Behavior: Behavior normal.    ED Course/Procedures   Clinical Course as of Jan 09 534  Sun Jan 10, 2019  0115 Notified by nursing staff that patient's swelling has continued to worsen.  My evaluation, the patient has swelling that extends to the right half of the inferior lip and to the right lower face.  She has no trismus or drooling.  She is protecting her airway and is not complaining of shortness of breath.  Patient is now receiving Toradol and I will add on Decadron.   [MM]    Clinical Course User Index [MM] Arbor Leer A, PA-C    Procedures  MDM   33 year old female received at signout from [facility] pending labs and imaging.  This is her second visit to the ER today  after she was initially seen for dental pain and then developed sudden onset, rapidly worsening swelling to the right lower face.  Labs are reassuring.  No abnormalities.  CT with odontogenic cellulitis of the right lower face secondary to large periapical lucency at the root of tooth 32.  No subperiosteal abscess or drainable fluid collection.  After the patient received a dose of IV clindamycin, Toradol, and Decadron, swelling was much improved.  She was tolerating fluids in the ER without difficulty.  She is having no shortness of breath, muffled voice, and is tolerating her own secretions.  Will discharge to home with oral clindamycin and follow-up with outpatient dentistry.  Patient is agreeable with plan.  She was also given strict return precautions to the emergency department.  All  questions answered.  She is hemodynamically stable and in no acute distress.  She is safe for discharge to home with outpatient close follow-up.       Joanne Gavel, PA-C 01/10/19 [place], [place], MD 01/11/19 [phone]

## 2019-01-09 NOTE — Discharge Instructions (Signed)
It is very important that you get evaluated by a dentist as soon as possible. Call tomorrow to schedule an appointment. Tylenol as needed for pain. Avoid ibuprofen, naproxen, aleve, goody powder, etc. Those can make your upper stomach hurt worse.   Take your full course of antibiotics. Read the instructions below.  Eat a soft or liquid diet and rinse your mouth out after meals with warm water. You should see a dentist or return here at once if you have increased swelling, increased pain or uncontrolled bleeding from the site of your injury.  SEEK MEDICAL CARE IF:  Swelling gets worse You have bleeding which starts, continues, or gets worse.  You develop a fever If you are unable to open your mouth New or worsening symptoms develop or you have any additional concerns.

## 2019-01-09 NOTE — ED Triage Notes (Signed)
Pt returns for continued dental pain, pt was discharged a few hours ago.

## 2019-01-09 NOTE — ED Provider Notes (Cosign Needed)
[facility] EMERGENCY DEPARTMENT Provider Note   CSN: [id number] Arrival date & time: 01/09/19  2227     History   Chief Complaint Chief Complaint  Patient presents with  . Dental Pain    HPI Carina Chaplin is a 33 y.o. female.     The history is provided by the patient and medical records. No language interpreter was used.  Dental Pain Associated symptoms: facial swelling    Madysen Faircloth is a 33 y.o. female who presents to the Emergency Department complaining of worsening facial swelling since ED evaluation earlier today. I saw this patient several hours ago for right lower dental pain. She reports that she has continued to have pain not relieved with Tylenol, but of more concern to her was the fact that the right side of her face began to swell up very badly. She did get PenVK rx filled. She took one dose when she picked up the prescription and another before she laid down for bed. She couldn't sleep so went to the bathroom and noticed how swollen her face looked compared to earlier. I told her to come back to ER should the swelling get drastically worse, which she did. She still has no fever, difficulty breathing or swallowing.   Past Medical History:  Diagnosis Date  . Cervical cancer [facility])    Patient did not ever get treatment    There are no active problems to display for this patient.   Past Surgical History:  Procedure Laterality Date  . ABDOMINAL CERCLAGE    . KNEE SURGERY       OB History    Gravida  6   Para  4   Term  3   Preterm  1   AB  2   Living  2     SAB      TAB      Ectopic      Multiple      Live Births               Home Medications    Prior to Admission medications   Medication Sig Start Date End Date Taking? Authorizing Provider  acetaminophen (TYLENOL) 500 MG tablet Take 500-1,000 mg by mouth every 6 (six) hours as needed for moderate pain.    [provider]  penicillin v potassium  (VEETID) 500 MG tablet Take 1 tablet (500 mg total) by mouth 4 (four) times daily for 7 days. 01/09/19 01/16/19  Ustin Cruickshank, Ozella Almond, PA-C    Family History Family History  Problem Relation Age of Onset  . ADD / ADHD Neg Hx   . Alcohol abuse Neg Hx   . Anxiety disorder Neg Hx   . Arthritis Neg Hx   . Asthma Neg Hx   . Birth defects Neg Hx   . Cancer Neg Hx   . COPD Neg Hx   . Depression Neg Hx   . Diabetes Neg Hx   . Drug abuse Neg Hx   . Early death Neg Hx   . Heart disease Neg Hx   . Hearing loss Neg Hx   . Hypertension Neg Hx   . Hyperlipidemia Neg Hx   . Intellectual disability Neg Hx   . Kidney disease Neg Hx   . Learning disabilities Neg Hx   . Miscarriages / Stillbirths Neg Hx   . Obesity Neg Hx   . Stroke Neg Hx   . Vision loss Neg Hx   . Varicose Veins  Neg Hx     Social History Social History   Tobacco Use  . Smoking status: Current Every Day Smoker    Packs/day: 0.50  . Smokeless tobacco: Never Used  Substance Use Topics  . Alcohol use: Not Currently  . Drug use: Not Currently     Allergies   Patient has no known allergies.   Review of Systems Review of Systems  HENT: Positive for dental problem and facial swelling.   All other systems reviewed and are negative.    Physical Exam Updated Vital Signs BP 117/76   Pulse 96   Temp 99.1 F (37.3 C) (Oral)   Resp 18   SpO2 100%   Physical Exam Vitals signs and nursing note reviewed.  Constitutional:      General: She is not in acute distress.    Appearance: She is well-developed.  HENT:     Head: Normocephalic and atraumatic.     Mouth/Throat:      Comments: Facial edema much more significant than my previous exam several hours ago. She does have tenderness to the right submandibular space. Diffuse tenderness to the gumline indicated by markings on imaging above. Right cheek feels indurated as well. No sublingual tenderness. Uvula midline. Neck:     Musculoskeletal: Neck supple.   Cardiovascular:     Rate and Rhythm: Normal rate and regular rhythm.     Heart sounds: Normal heart sounds. No murmur.  Pulmonary:     Effort: Pulmonary effort is normal. No respiratory distress.     Breath sounds: Normal breath sounds. No wheezing or rales.  Musculoskeletal: Normal range of motion.  Skin:    General: Skin is warm and dry.  Neurological:     Mental Status: She is alert.      ED Treatments / Results  Labs (all labs ordered are listed, but only abnormal results are displayed) Labs Reviewed - No data to display  EKG    Radiology No results found.  Procedures Procedures (including critical care time)  Medications Ordered in ED Medications  clindamycin (CLEOCIN) IVPB 600 mg (has no administration in time range)  oxyCODONE-acetaminophen (PERCOCET/ROXICET) 5-325 MG per tablet 1 tablet (has no administration in time range)     Initial Impression / Assessment and Plan / ED Course  I have reviewed the triage vital signs and the nursing notes.  Pertinent labs & imaging results that were available during my care of the patient were reviewed by me and considered in my medical decision making (see chart for details).       Najmo Pardue is a 33 y.o. female who presents to ED for dental pain and facial swelling. I actually saw her earlier today around 3:30 PM for same. At that time, she had very minimal facial edema. I started her on PenVK. She did get rx filled and has taken two doses thus far. Since her discharge from ED a few hours ago, she has had worsening right-sided facial swelling. It is much more extensive than previous encounter. She has no sublingual tenderness, but is overtly tender in the right submandibular space. Cheek is also quite indurated. Likely has dental abscess, possibly facial cellulitis. Given worsening swelling and submandibular tenderness, will obtain CT to further characterize and ensure no spread to deep neck space. Will also give dose of  IV ABX while in ED.   Labs and CT pending at shift change. Care assumed by oncoming provider PA McDonald. Case discussed, plan agreed upon. Will follow up on  pending tests and dispo appropriately.    Final Clinical Impressions(s) / ED Diagnoses   Final diagnoses:  None    ED Discharge Orders    None       Daxtin Leiker, Ozella Almond, [place] 01/09/19 2353

## 2019-01-09 NOTE — ED Notes (Signed)
Patient verbalizes understanding of discharge instructions. Opportunity for questioning and answering were provided.  patient discharged from ED.  

## 2019-01-10 ENCOUNTER — Telehealth: Payer: Self-pay | Admitting: Surgery

## 2019-01-10 ENCOUNTER — Emergency Department (HOSPITAL_COMMUNITY): Payer: Medicaid Other

## 2019-01-10 LAB — BASIC METABOLIC PANEL
Anion gap: 10 (ref 5–15)
BUN: 9 mg/dL (ref 6–20)
CO2: 23 mmol/L (ref 22–32)
Calcium: 9.1 mg/dL (ref 8.9–10.3)
Chloride: 103 mmol/L (ref 98–111)
Creatinine, Ser: 0.93 mg/dL (ref 0.44–1.00)
GFR calc Af Amer: >60 mL/min (ref >60)
GFR calc non Af Amer: >60 mL/min (ref >60)
Glucose, Bld: 91 mg/dL (ref 70–99)
Potassium: 3.8 mmol/L (ref 3.5–5.1)
Sodium: 136 mmol/L (ref 135–145)

## 2019-01-10 LAB — CBC WITH DIFFERENTIAL/PLATELET
Abs Immature Granulocytes: 0.02 10*3/uL (ref 0.00–0.07)
Basophils Absolute: 0 10*3/uL (ref 0.0–0.1)
Basophils Relative: 0 %
Eosinophils Absolute: 0 10*3/uL (ref 0.0–0.5)
Eosinophils Relative: 0 %
HCT: 39.9 % (ref 36.0–46.0)
Hemoglobin: 13.7 g/dL (ref 12.0–15.0)
Immature Granulocytes: 0 %
Lymphocytes Relative: 22 %
Lymphs Abs: 2.2 10*3/uL (ref 0.7–4.0)
MCH: 33.8 pg (ref 26.0–34.0)
MCHC: 34.3 g/dL (ref 30.0–36.0)
MCV: 98.5 fL (ref 80.0–100.0)
Monocytes Absolute: 0.9 10*3/uL (ref 0.1–1.0)
Monocytes Relative: 9 %
Neutro Abs: 6.8 10*3/uL (ref 1.7–7.7)
Neutrophils Relative %: 69 %
Platelets: 228 10*3/uL (ref 150–400)
RBC: 4.05 MIL/uL (ref 3.87–5.11)
RDW: 14.2 % (ref 11.5–15.5)
WBC: 10 10*3/uL (ref 4.0–10.5)
nRBC: 0 % (ref 0.0–0.2)

## 2019-01-10 MED ORDER — IBUPROFEN 600 MG PO TABS: 600.0000 mg | ORAL_TABLET | Freq: Four times a day (QID) | ORAL | 0 refills | Status: DC | PRN

## 2019-01-10 MED ORDER — DEXAMETHASONE SODIUM PHOSPHATE 10 MG/ML IJ SOLN
10.0000 mg | Freq: Once | INTRAMUSCULAR | Status: AC
  Administered 2019-01-10: 10 mg via INTRAVENOUS
  Filled 2019-01-10: qty 1

## 2019-01-10 MED ORDER — CLINDAMYCIN HCL 150 MG PO CAPS: 450.0000 mg | ORAL_CAPSULE | Freq: Three times a day (TID) | ORAL | 0 refills | Status: AC

## 2019-01-10 MED ORDER — IBUPROFEN 600 MG PO TABS: 600.0000 mg | ORAL_TABLET | Freq: Four times a day (QID) | ORAL | 0 refills | Status: AC | PRN

## 2019-01-10 MED ORDER — IOHEXOL 300 MG/ML  SOLN
75.0000 mL | Freq: Once | INTRAMUSCULAR | Status: AC | PRN
  Administered 2019-01-10: 75 mL via INTRAVENOUS

## 2019-01-10 NOTE — Telephone Encounter (Signed)
ED CM received call from [facility] concerning prescription clarification.  CM provided clarification no additional ED CM needs identified.

## 2019-01-10 NOTE — Discharge Instructions (Addendum)
Thank you for allowing me to care for you today in the Emergency Department.   Make sure to call first thing on Monday morning to schedule an appointment with a dentist for follow-up.  To help with pain and swelling, take 600 mg of ibuprofen with food every 6 hours while you are awake.  You can apply an ice pack or cool compress to the skin for 15 to 20 minutes as frequently as needed.  Avoid applying heat to the face because this can make your swelling and pain worse.  Take 3 capsules (450 mg) of clindamycin every 8 hours for the next week.  Make sure to take the entire course to avoid antibiotic resistance.  Your swelling and pain should significantly improve within the next 48 to 72 hours on antibiotics.  You were given your first dose of IV antibiotics tonight.  You can take your next dose of clindamycin in the morning.  You should return to the emergency department if you become unable to swallow, if you are drooling and unable to swallow your own saliva, if you become unable to open your mouth, if you feel as if your throat is closing, become short of breath, or develop other new, concerning symptoms.

## 2019-01-10 NOTE — ED Notes (Signed)
Patient verbalizes understanding of discharge instructions. Opportunity for questioning and answers were provided. Armband removed by staff, pt discharged from ED.  

## 2019-01-10 NOTE — ED Notes (Addendum)
Pt c/o pain on her teeth, gum, jaw (10/10) and asks for some pain meds.

## 2019-01-10 NOTE — ED Notes (Signed)
Patient transported to CT 

## 2019-01-10 NOTE — ED Notes (Signed)
Pt able to drink fluids without difficulty. No drooling or choking.

## 2019-01-10 NOTE — ED Notes (Signed)
Pt able to drink water without difficulty. No

## 2019-11-07 IMAGING — US US TRANSVAGINAL NON-OB
1 series · 15 of 25 positions shown · non-contrast
Comparison: None

CLINICAL DATA: Abnormal uterine bleeding for 3 weeks.

EXAM:
TRANSABDOMINAL AND TRANSVAGINAL ULTRASOUND OF PELVIS
TECHNIQUE: Both transabdominal and transvaginal ultrasound examinations of the
pelvis were performed. Transabdominal technique was performed for
global imaging of the pelvis including uterus, ovaries, adnexal
regions, and pelvic cul-de-sac. It was necessary to proceed with
endovaginal exam following the transabdominal exam to visualize the
endometrium and ovaries.

[Series 1: us transvaginal non-ob · 15 of 59 slices shown]
[im 1/59]
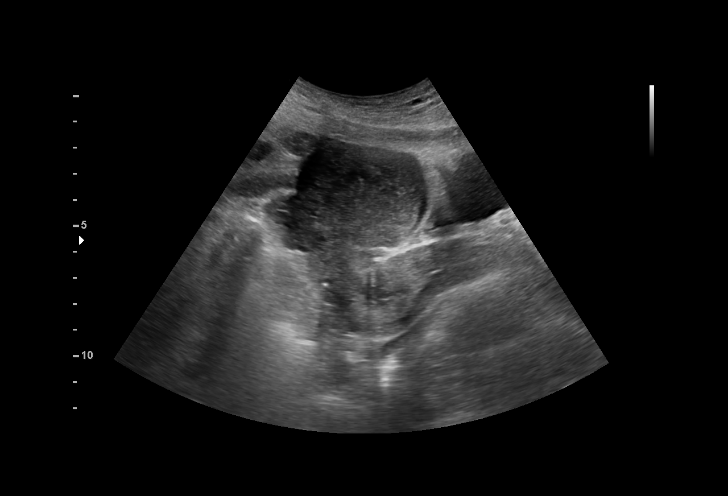
[im 5/59]
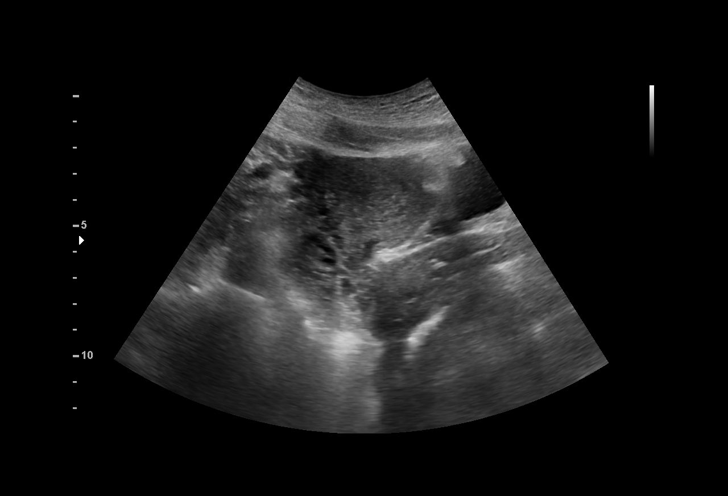
[im 10/59]
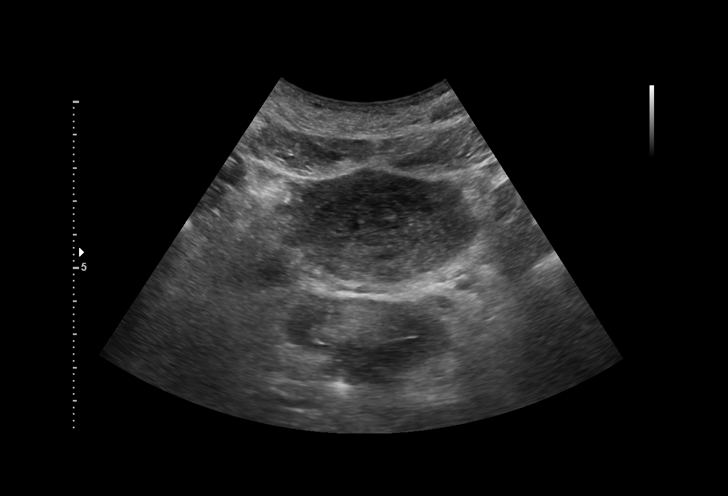
[im 13/59]
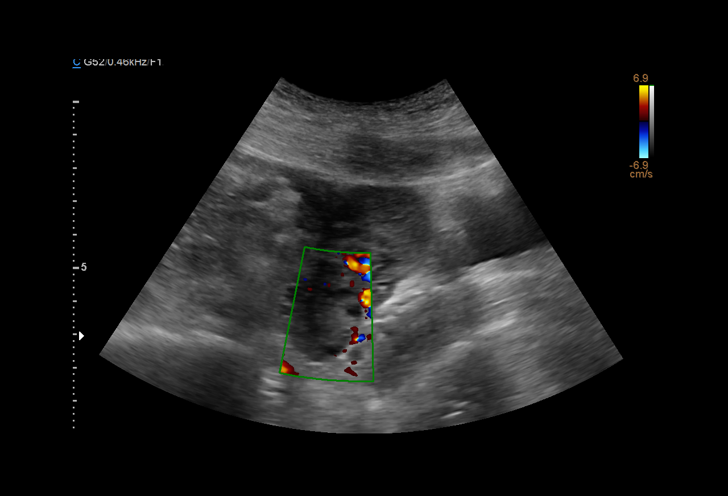
[im 17/59]
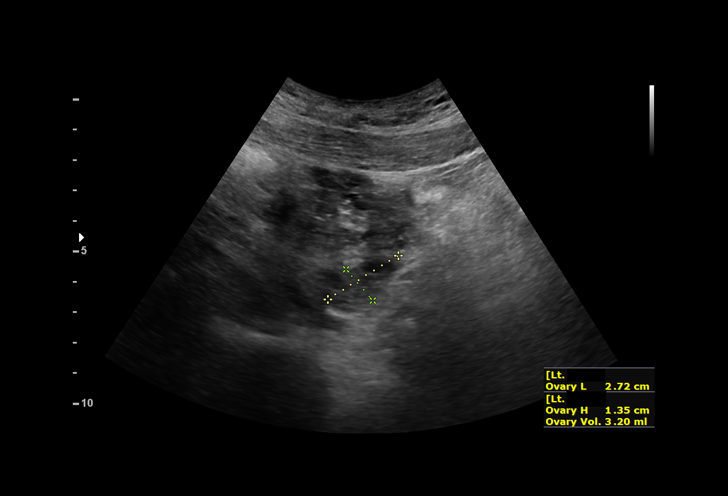
[im 22/59]
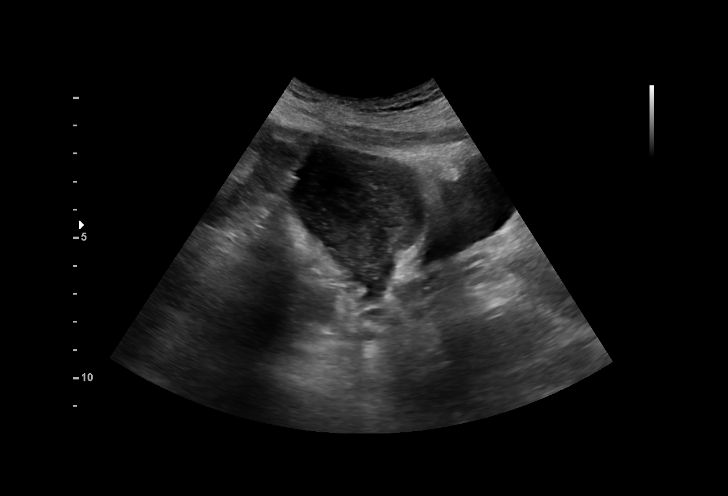
[im 25/59]
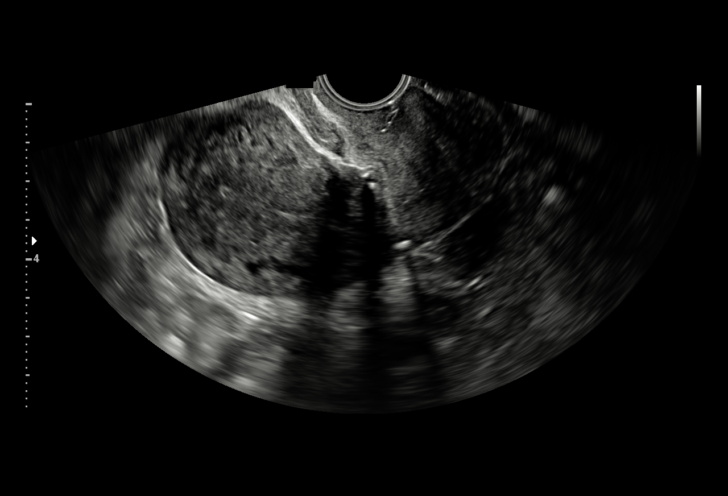
[im 30/59]
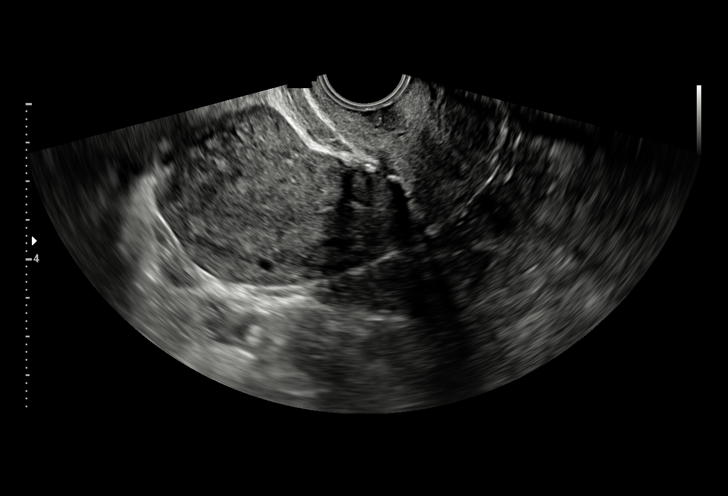
[im 34/59]
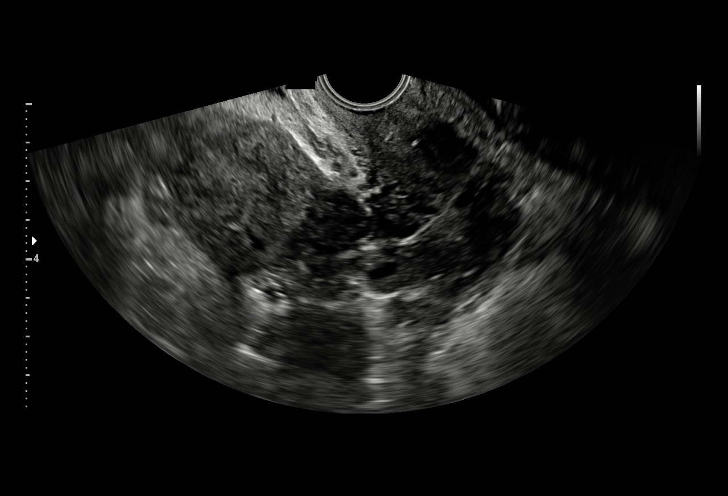
[im 37/59]
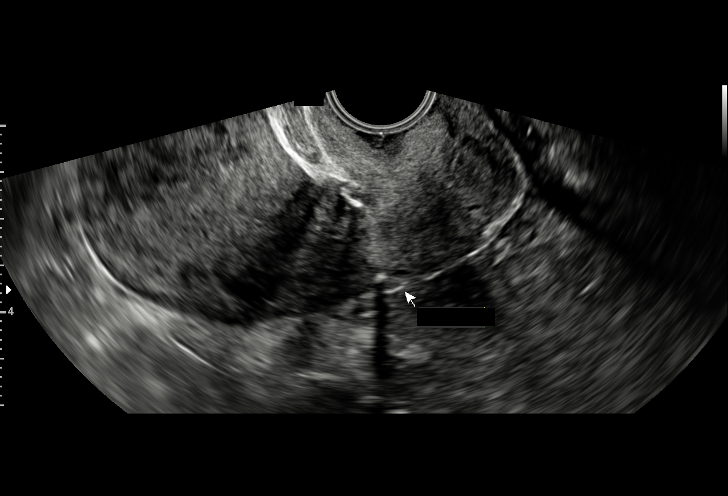
[im 42/59]
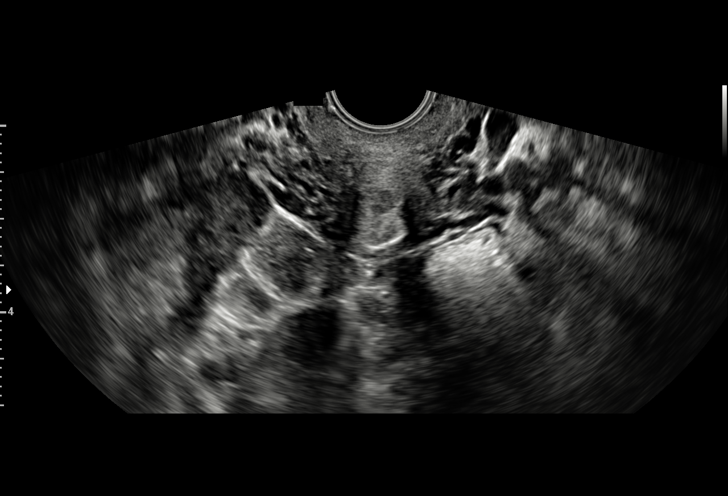
[im 46/59]
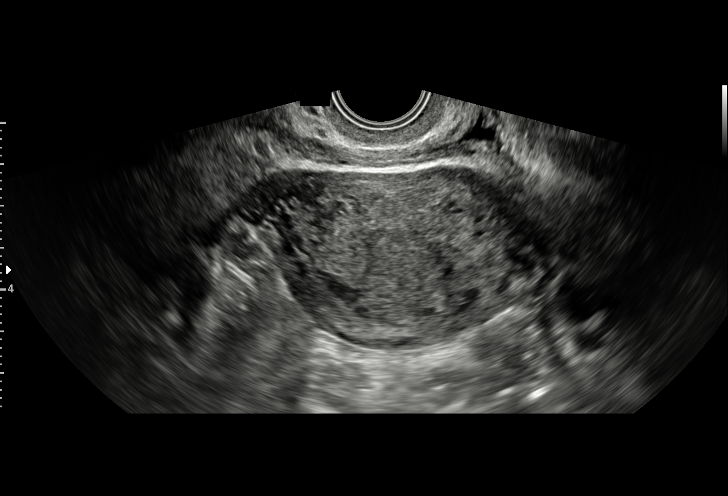
[im 49/59]
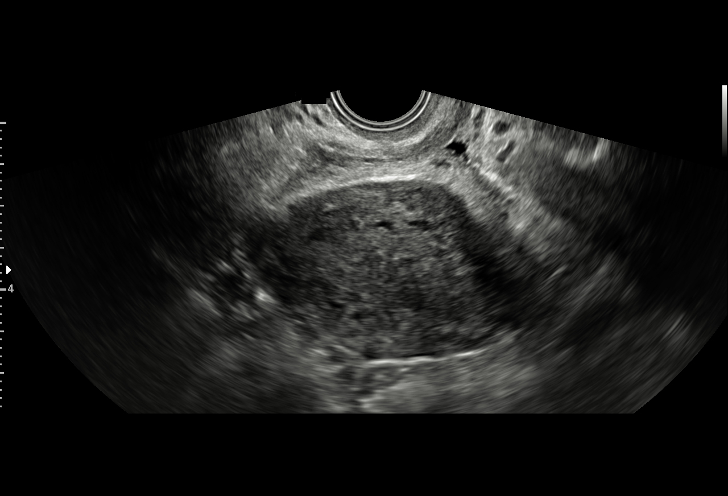
[im 54/59]
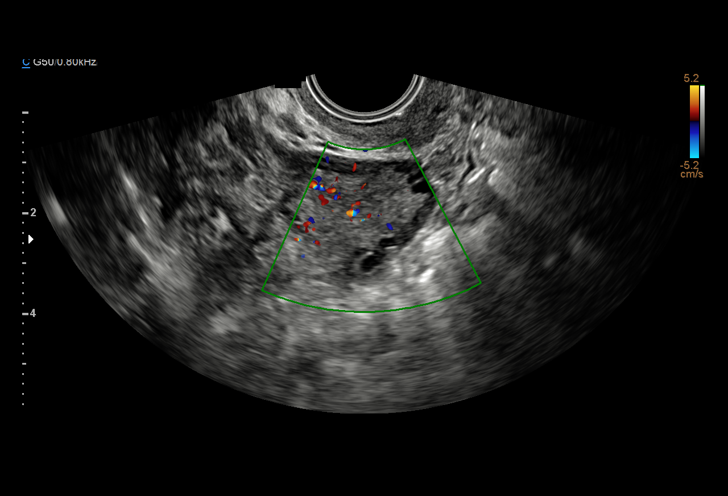
[im 59/59]
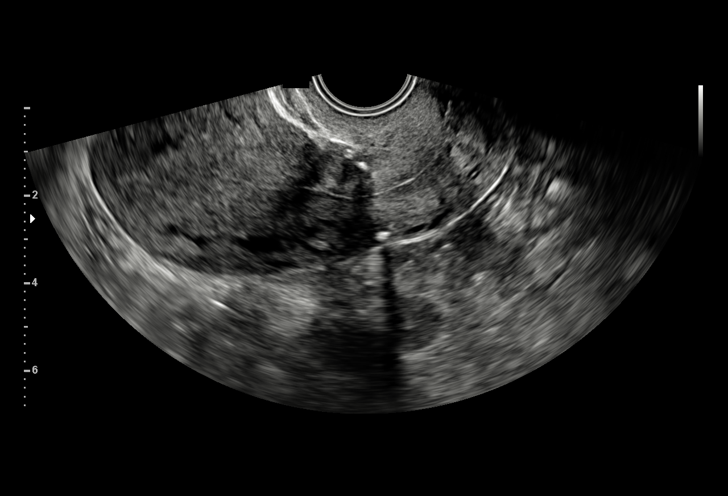

[15 of 25 positions shown; findings below may reference images not displayed]

FINDINGS: Uterus

Measurements: 9.9 x 4.8 x 5.5 cm. No fibroids or other mass
visualized. Cerclage noted at the junction of the cervix and lower
uterine segment.

Endometrium

Thickness: 5 mm.  No focal abnormality visualized.

Right ovary

Measurements: 3.3 x 2.6 x 2.4 cm. Normal appearance/no adnexal mass.

Left ovary

Measurements: 3.8 x 1.4 x 2.7 cm. Normal appearance/no adnexal mass.

Other findings

No abnormal free fluid.
IMPRESSION: No evidence of uterine fibroids. Endometrial thickness measures 5
mm. If bleeding remains unresponsive to hormonal or medical therapy,
sonohysterogram should be considered for focal lesion work-up. (Ref:
Radiological Reasoning: Algorithmic Workup of Abnormal Vaginal
Bleeding with Endovaginal Sonography and Sonohysterography. AJR
[id number]; 191:S68-73).

Normal appearance of both ovaries.  No adnexal mass identified.

## 2020-07-18 IMAGING — CT CT NECK WITH CONTRAST
5 of 6 series · 14 of 33 positions shown, 16 images · IV contrast (APPLIED)
Comparison: None.

CLINICAL DATA: Right-sided dental pain and swelling

EXAM:
CT NECK WITH CONTRAST
TECHNIQUE: Multidetector CT imaging of the neck was performed using the
standard protocol following the bolus administration of intravenous
contrast.
CONTRAST:  75mL OMNIPAQUE IOHEXOL 300 MG/ML  SOLN

[Series 3: axial neck · axial · 0.51mm/px · z∈[-166,-86]mm · 2 of 122 slices shown, 3 images]
[im 41/122  soft-tissue]
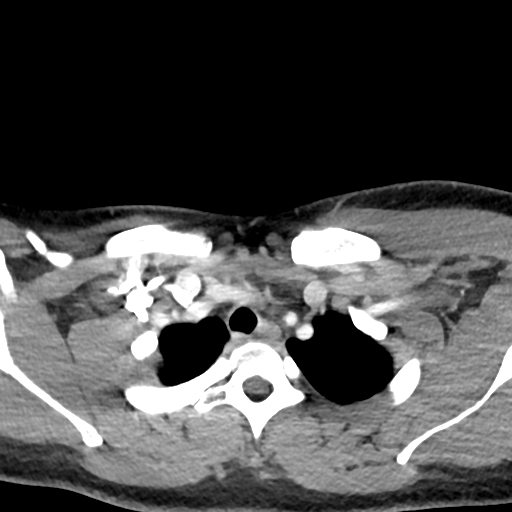
[im 41/122  bone]
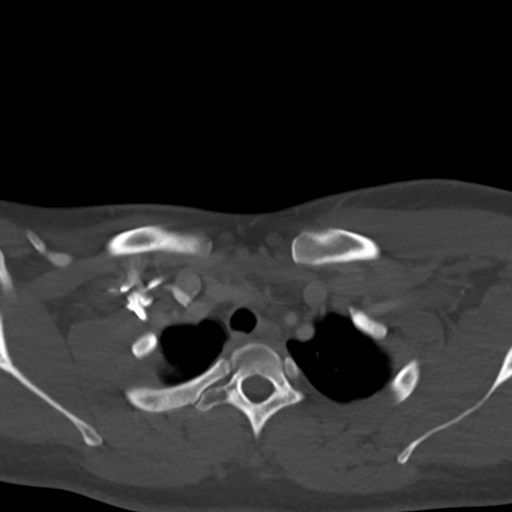
[im 81/122  bone]
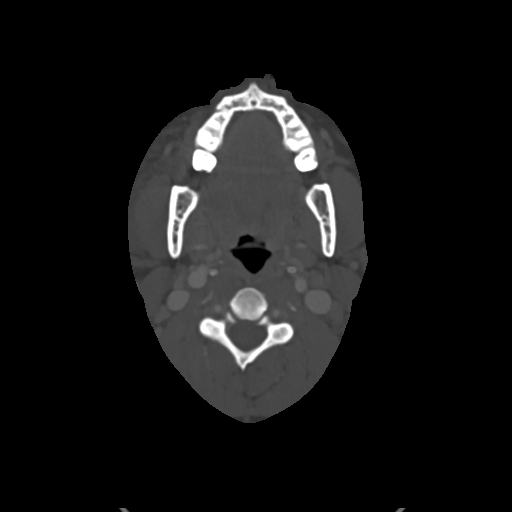

[Series 4: axial bone · axial · 0.51mm/px · z∈[-166,-86]mm · 2 of 122 slices shown]
[im 41/122  bone]
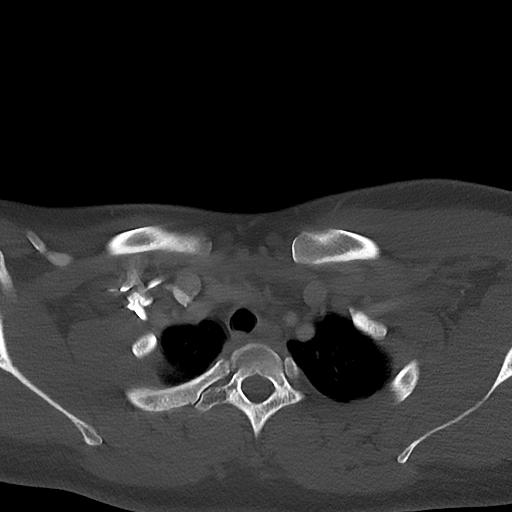
[im 81/122  bone]
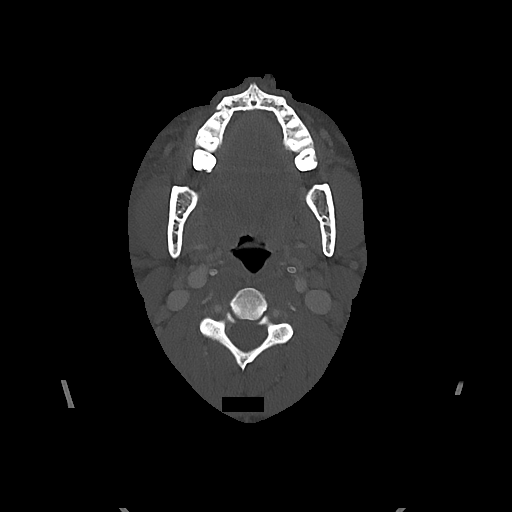

[Series 6: sag neck · sagittal · 0.48mm/px · 5 of 101 slices shown, 6 images]
[im 34/101  bone]
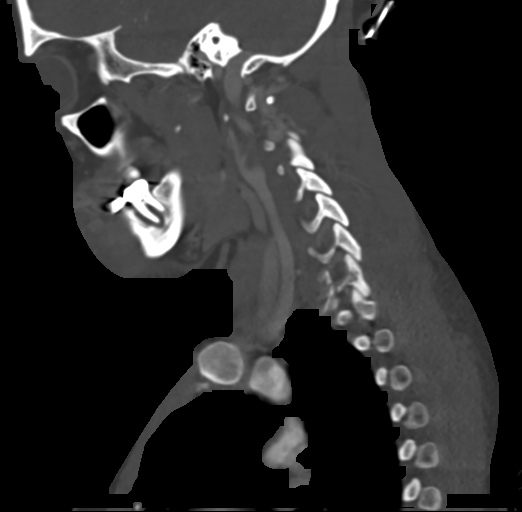
[im 42/101  bone]
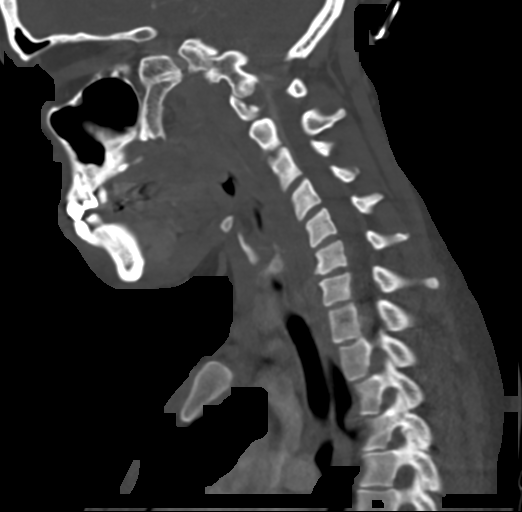
[im 51/101  soft-tissue]
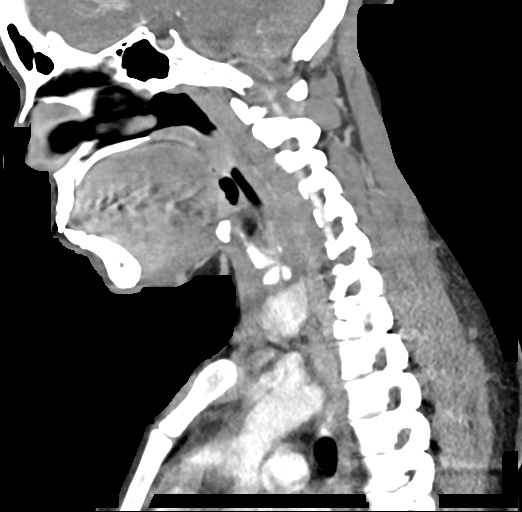
[im 51/101  bone]
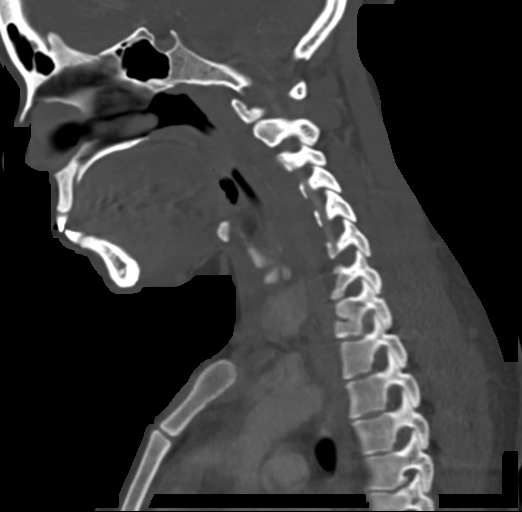
[im 59/101  bone]
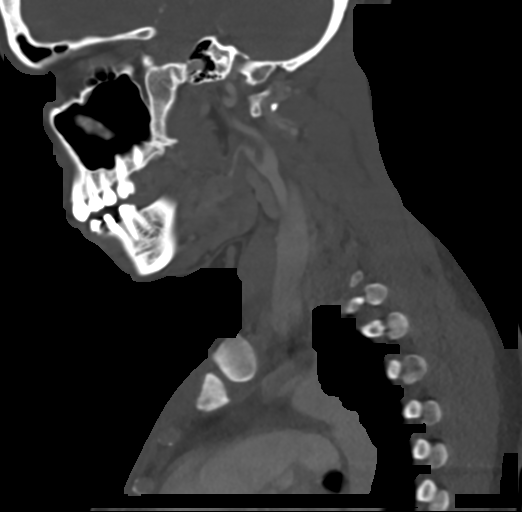
[im 67/101  bone]
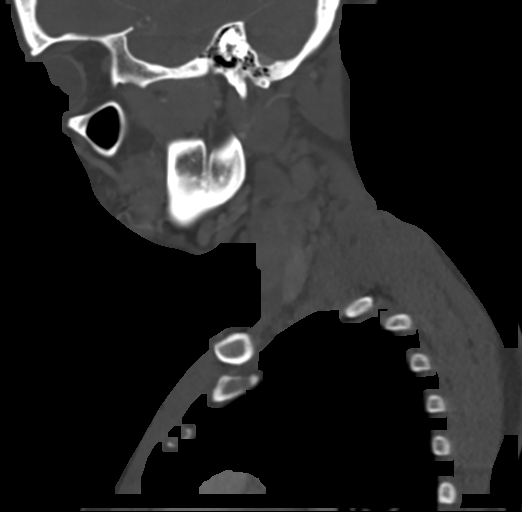

[Series 7: cor neck · coronal · 0.41mm/px · 3 of 114 slices shown]
[im 23/114  bone]
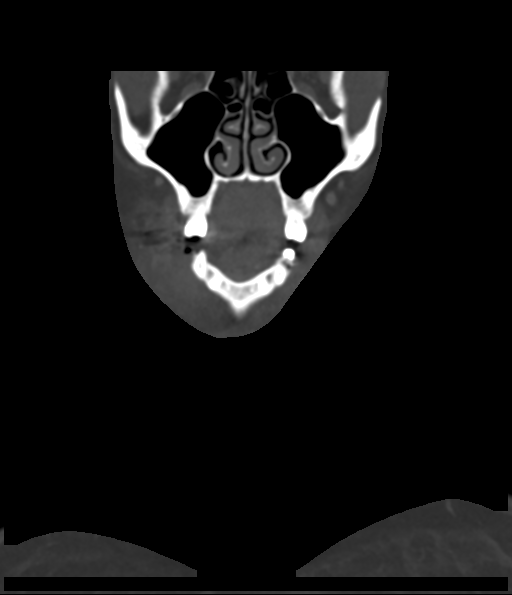
[im 46/114  bone]
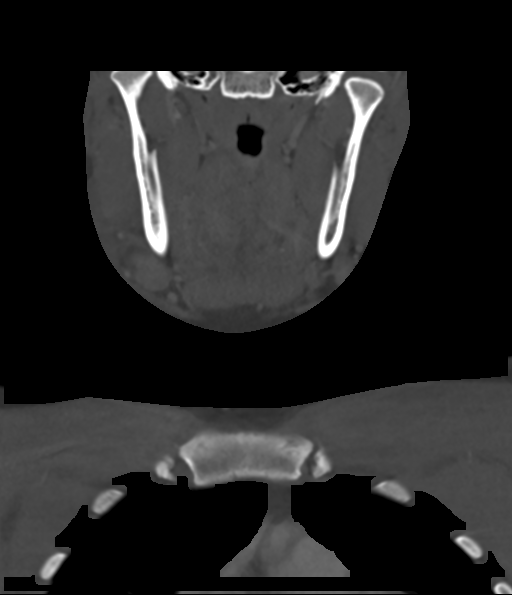
[im 68/114  bone]
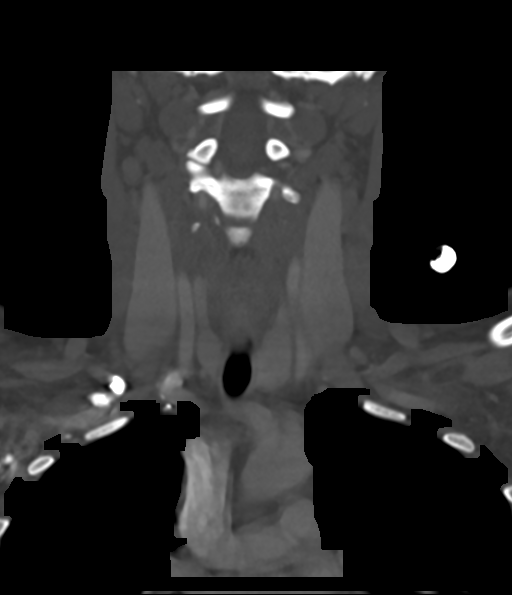

[Series 8: ax oropharynx · axial · 0.39mm/px · z∈[-162,-82]mm · 2 of 120 slices shown]
[im 40/120  bone]
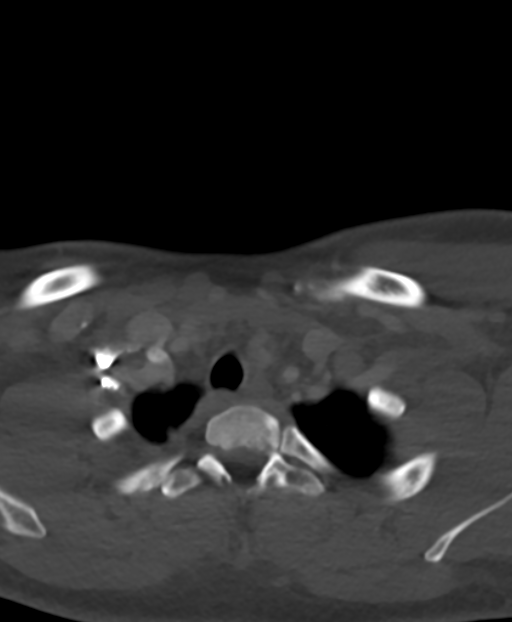
[im 80/120  bone]
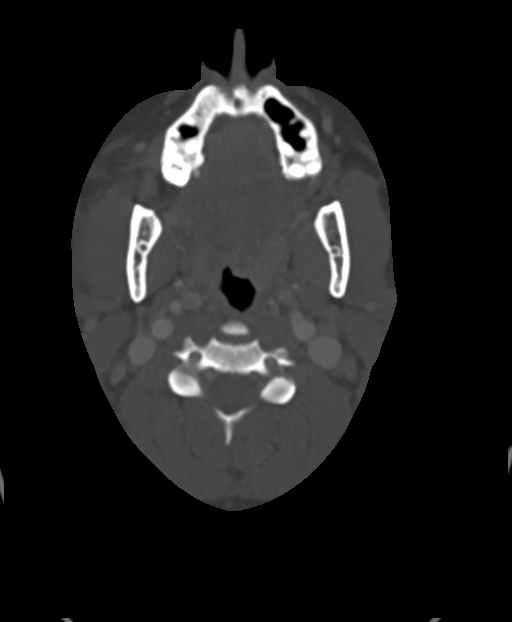

[14 of 33 positions shown; findings below may reference images not displayed]

FINDINGS: PHARYNX AND LARYNX:

--Nasopharynx: Fossae of Hangula are clear. Normal adenoid
tonsils for age.

--Oral cavity and oropharynx: There is a large periapical lucency at
the root of tooth 32 with overlying soft tissue swelling of the
lower right face. No subperiosteal abscess or fluid collection.

--Hypopharynx: Normal vallecula and pyriform sinuses.

--Larynx: Normal epiglottis and pre-epiglottic space. Normal
aryepiglottic and vocal folds.

--Retropharyngeal space: No abscess, effusion or lymphadenopathy.

SALIVARY GLANDS:

--Parotid: No mass lesion or inflammation. No sialolithiasis or
ductal dilatation.

--Submandibular: Symmetric without inflammation. No sialolithiasis
or ductal dilatation.

--Sublingual: Normal. No ranula or other visible lesion of the base
of tongue and floor of mouth.

THYROID: Normal.

LYMPH NODES: Enlarged right level 1B nodes measure 10 mm.

VASCULAR: Major cervical vessels are patent.

LIMITED INTRACRANIAL: Normal.

VISUALIZED ORBITS: Normal.

MASTOIDS AND VISUALIZED PARANASAL SINUSES: No fluid levels or
advanced mucosal thickening. No mastoid effusion.

SKELETON: No bony spinal canal stenosis. No lytic or blastic
lesions.

UPPER CHEST: Clear.

OTHER: None.
IMPRESSION: Odontogenic cellulitis of the right face secondary to large
periapical lucency at the root of tooth 32. No subperiosteal abscess
or drainable fluid collection.
